# Patient Record
Sex: Male | Born: 2005
Health system: Southern US, Community
[De-identification: ages and names within clinical notes are randomized; demographics above are authoritative.]

## PROBLEM LIST (undated history)

## (undated) DIAGNOSIS — R278 Other lack of coordination: Secondary | ICD-10-CM

## (undated) HISTORY — DX: Other lack of coordination: R27.8

## (undated) HISTORY — PX: LACRIMAL DUCT PROBING: SHX1903

---
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
## 2023-02-20 ENCOUNTER — Other Ambulatory Visit (HOSPITAL_COMMUNITY): Payer: Self-pay

## 2023-03-06 IMAGING — DX DG RIBS W/ CHEST 3+V*L*
3 series · 3 of 3 positions shown · non-contrast
Comparison: Chest/rib radiographs 07/18/2020 and chest CT
07/21/2020

CLINICAL DATA: Follow-up rib fracture.

EXAM:
LEFT RIBS AND CHEST - 3+ VIEW

[chest pa]
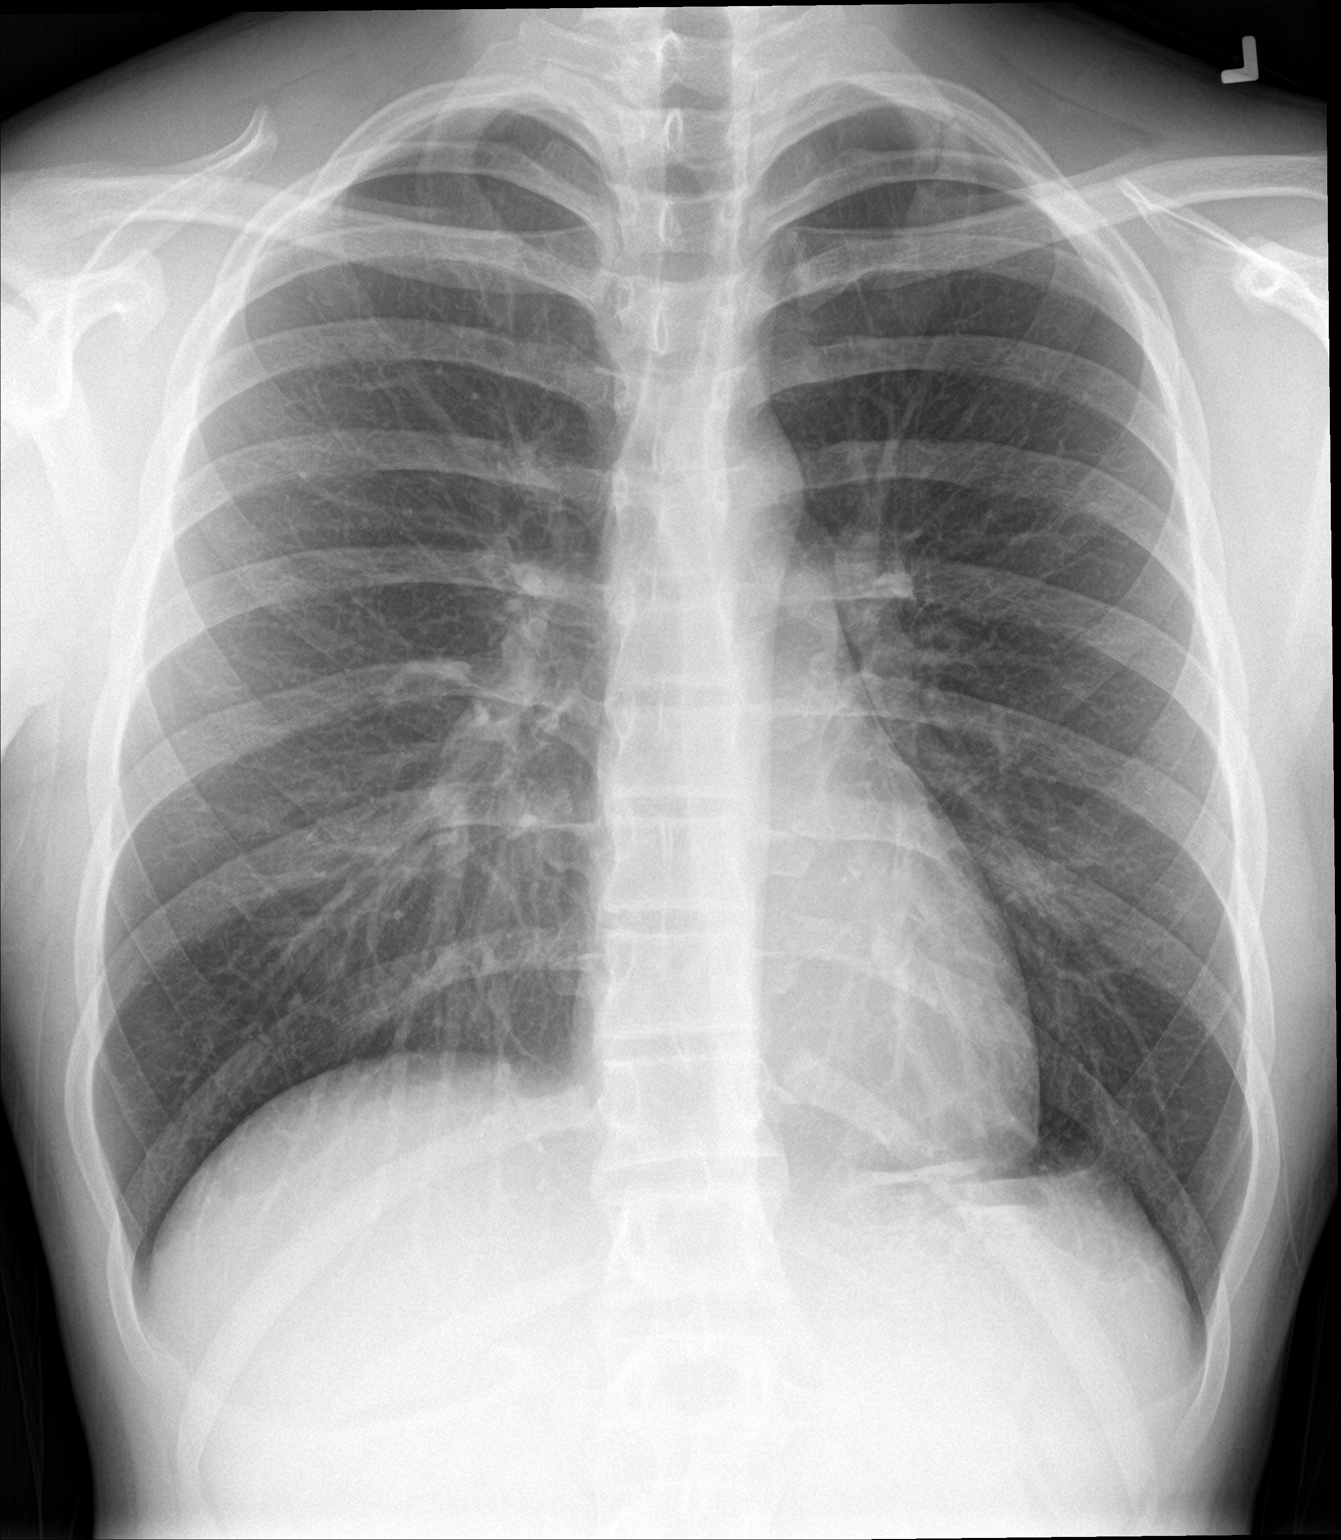

[rib pa]
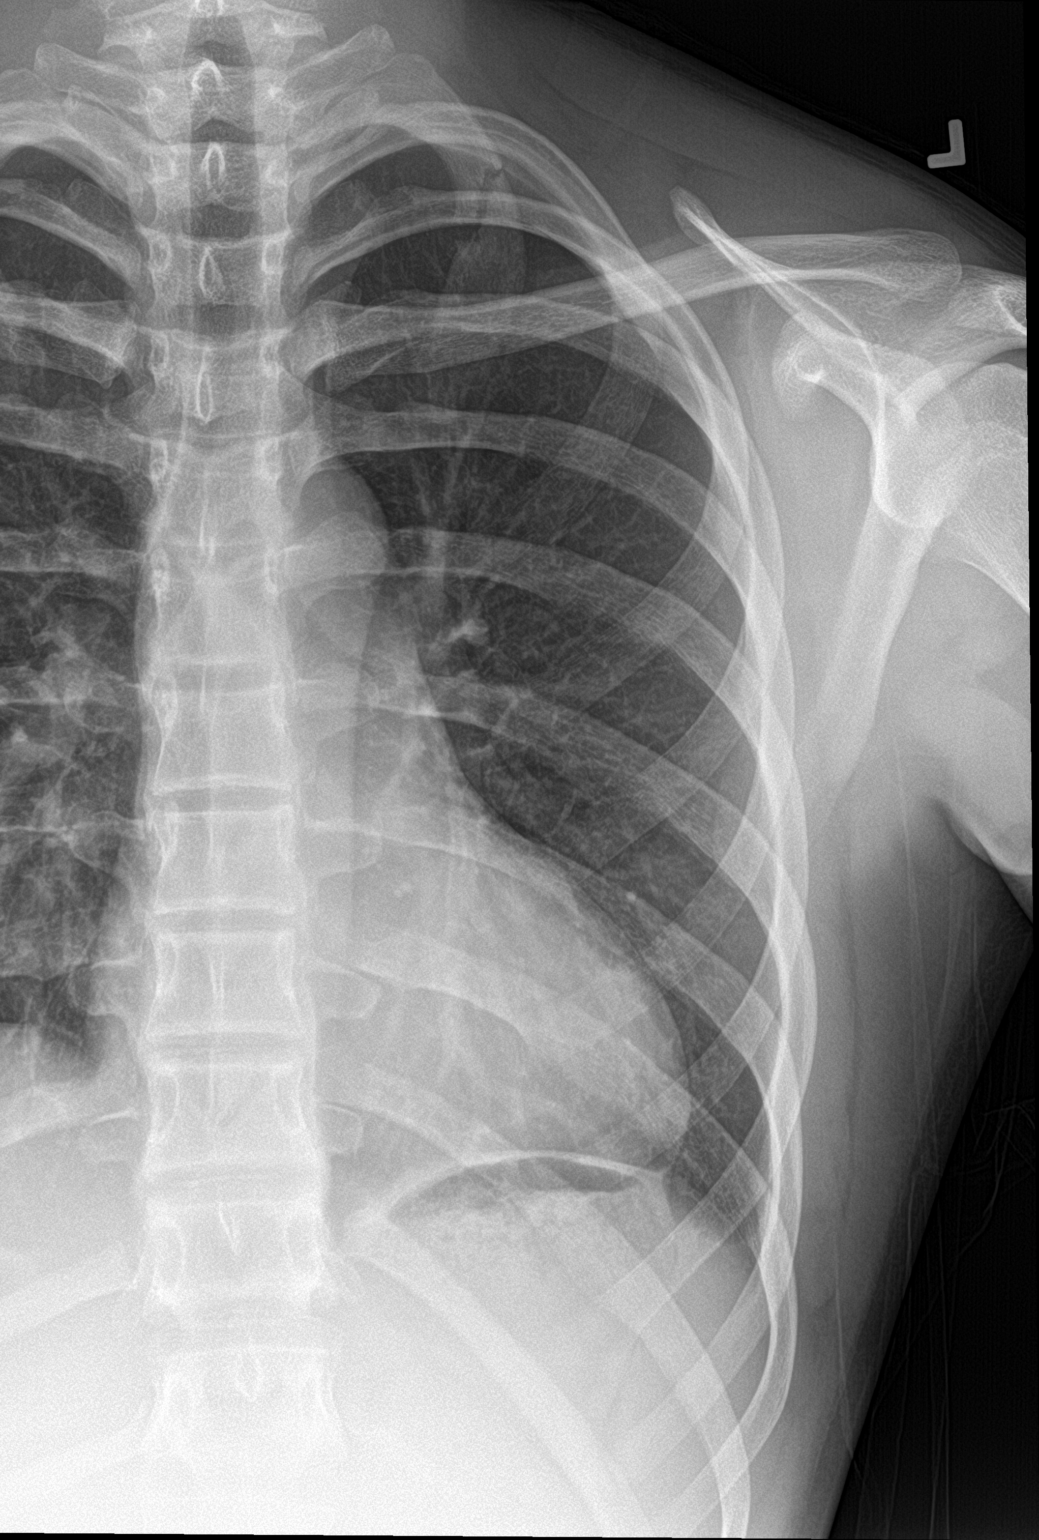

[rib pa obl]
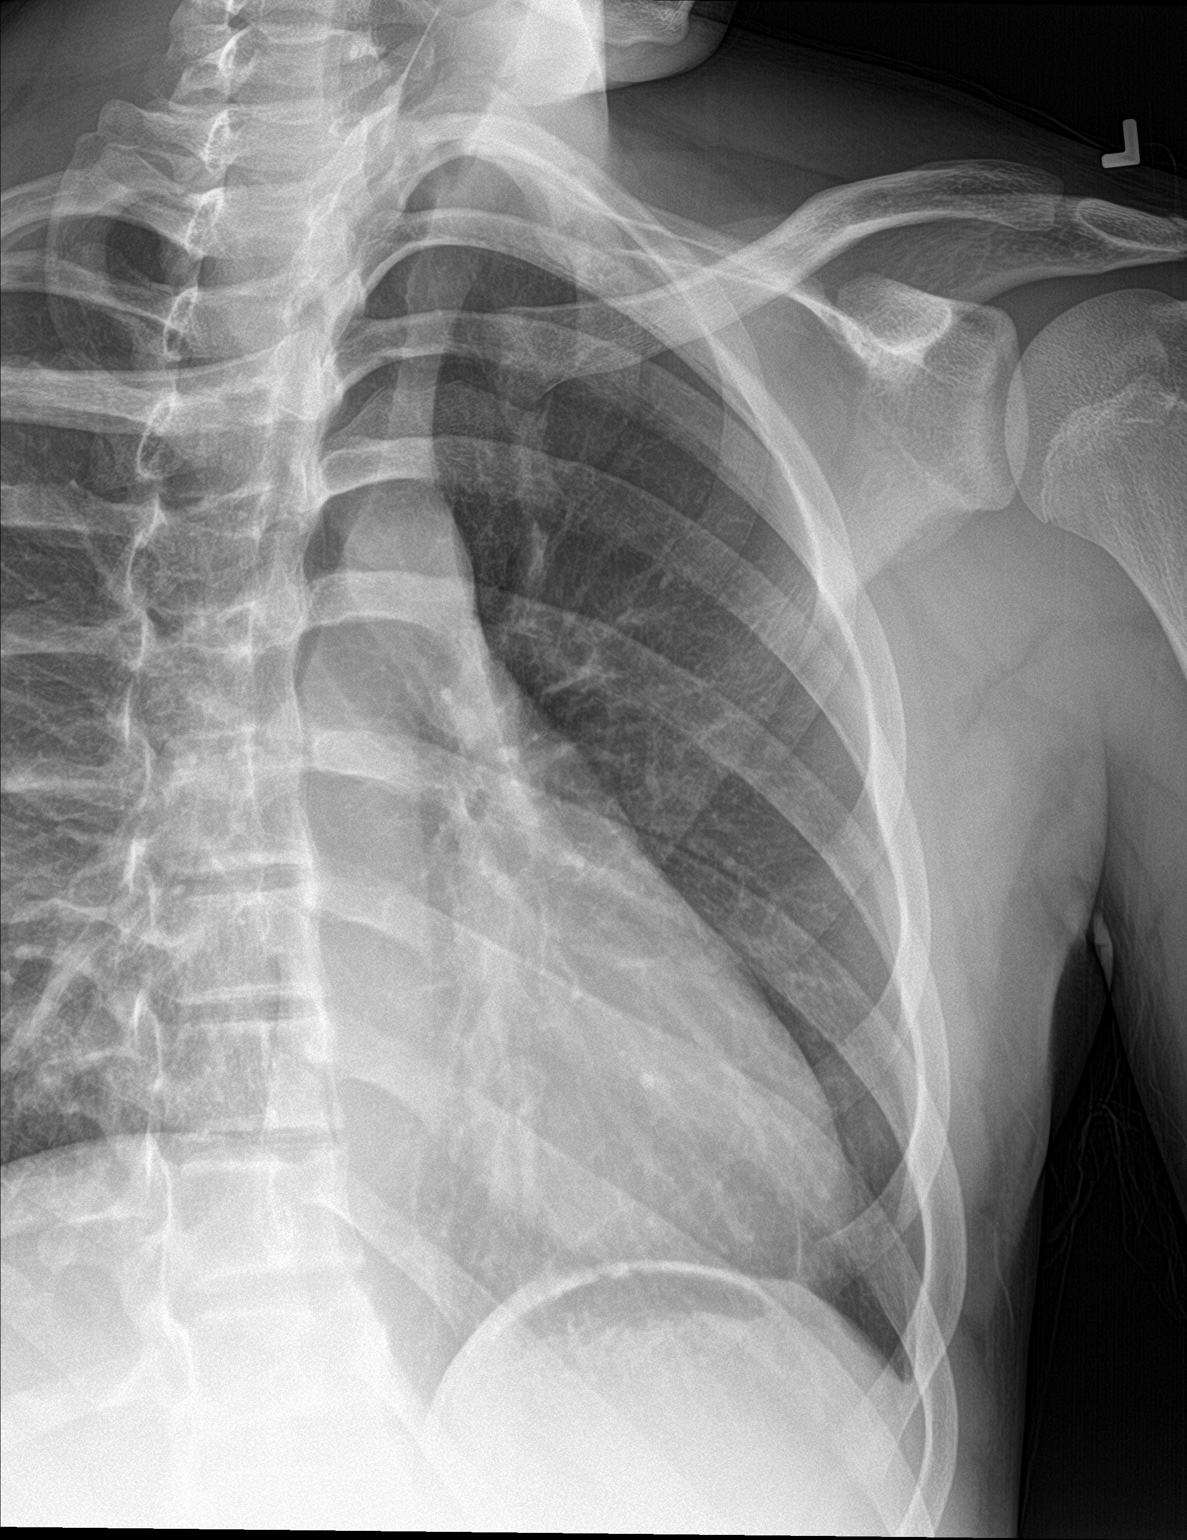

[3 of 3 positions shown; findings below may reference images not displayed]

FINDINGS: The cardiomediastinal silhouette is within normal limits. The lungs
are well inflated and clear. No pleural effusion or pneumothorax is
identified. There is evidence of interval callus formation about the
nondisplaced left first rib fracture.
IMPRESSION: Healing left first rib fracture.

## 2023-09-30 ENCOUNTER — Encounter: Payer: Self-pay | Admitting: Sports Medicine

## 2023-12-12 ENCOUNTER — Other Ambulatory Visit (HOSPITAL_COMMUNITY): Payer: Self-pay

## 2023-12-29 ENCOUNTER — Other Ambulatory Visit (HOSPITAL_BASED_OUTPATIENT_CLINIC_OR_DEPARTMENT_OTHER): Payer: Self-pay

## 2023-12-29 MED ORDER — VENLAFAXINE HCL ER 75 MG PO CP24
75.0000 mg | ORAL_CAPSULE | Freq: Every day | ORAL | 0 refills | Status: DC
  Filled 2023-12-29: qty 30, 30d supply, fill #0

## 2023-12-30 ENCOUNTER — Other Ambulatory Visit (HOSPITAL_BASED_OUTPATIENT_CLINIC_OR_DEPARTMENT_OTHER): Payer: Self-pay

## 2023-12-30 ENCOUNTER — Other Ambulatory Visit (HOSPITAL_COMMUNITY): Payer: Self-pay

## 2024-01-05 ENCOUNTER — Other Ambulatory Visit (HOSPITAL_BASED_OUTPATIENT_CLINIC_OR_DEPARTMENT_OTHER): Payer: Self-pay

## 2024-01-05 MED ORDER — VENLAFAXINE HCL ER 75 MG PO CP24: 75.0000 mg | ORAL_CAPSULE | Freq: Every day | ORAL | 0 refills | Status: AC

## 2024-01-27 ENCOUNTER — Other Ambulatory Visit (HOSPITAL_BASED_OUTPATIENT_CLINIC_OR_DEPARTMENT_OTHER): Payer: Self-pay

## 2024-01-27 MED ORDER — VENLAFAXINE HCL ER 75 MG PO CP24
75.0000 mg | ORAL_CAPSULE | Freq: Every day | ORAL | 0 refills | Status: AC
  Filled 2024-01-27: qty 90, 90d supply, fill #0

## 2024-02-02 ENCOUNTER — Other Ambulatory Visit (HOSPITAL_COMMUNITY): Payer: Self-pay

## 2024-02-18 ENCOUNTER — Other Ambulatory Visit (HOSPITAL_BASED_OUTPATIENT_CLINIC_OR_DEPARTMENT_OTHER): Payer: Self-pay

## 2024-02-18 MED ORDER — VENLAFAXINE HCL ER 75 MG PO CP24: 75.0000 mg | ORAL_CAPSULE | Freq: Every day | ORAL | 0 refills | Status: AC
